# Patient Record
Sex: Female | Born: 2000 | Race: White | Hispanic: No | Marital: Single | Smoking: Never smoker
Health system: Southern US, Community
[De-identification: ages and names within clinical notes are randomized; demographics above are authoritative.]

## PROBLEM LIST (undated history)

## (undated) DIAGNOSIS — R569 Unspecified convulsions: Secondary | ICD-10-CM

## (undated) DIAGNOSIS — G43909 Migraine, unspecified, not intractable, without status migrainosus: Secondary | ICD-10-CM

## (undated) DIAGNOSIS — H539 Unspecified visual disturbance: Secondary | ICD-10-CM

## (undated) DIAGNOSIS — E669 Obesity, unspecified: Secondary | ICD-10-CM

## (undated) DIAGNOSIS — T7840XA Allergy, unspecified, initial encounter: Secondary | ICD-10-CM

## (undated) HISTORY — DX: Migraine, unspecified, not intractable, without status migrainosus: G43.909

## (undated) HISTORY — PX: NO PAST SURGERIES: SHX2092

---
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
## 2018-03-06 ENCOUNTER — Ambulatory Visit: Payer: Self-pay | Admitting: Physician Assistant

## 2018-03-06 NOTE — Progress Notes (Deleted)
       Patient: Robyn Key Female    DOB: 12/03/2000   17 y.o.   MRN: [id number] Visit Date: 03/06/2018  Today's Provider: Trey Sailors, PA-C   No chief complaint on file.  Subjective:    Neck Pain       Allergies  Allergen Reactions  . Mango Flavor Other (See Comments)    Tingling of lips, mom denies swelling of lips or tongue or difficulty breathing.  . Papaya Derivatives Other (See Comments)    Tingling of the lips, denies swelling of the lips or tongue or difficulty breathing.  Jacelyn Grip Other (See Comments)    Tingling of the lips, mom denies swelling of lips or tongue or difficulty breathing.     Current Outpatient Medications:  .  acetaminophen (TYLENOL) 500 MG tablet, Take 500 mg by mouth every 6 (six) hours as needed., Disp: , Rfl:  .  fluticasone (FLONASE) 50 MCG/ACT nasal spray, Place 2 sprays into both nostrils daily., Disp: 16 g, Rfl: 6 .  ibuprofen (ADVIL,MOTRIN) 200 MG tablet, Take 200 mg by mouth every 6 (six) hours as needed., Disp: , Rfl:   Review of Systems  HENT: Negative.   Respiratory: Negative.   Gastrointestinal: Negative.   Genitourinary: Negative.   Musculoskeletal: Positive for neck pain.  Neurological: Negative.     Social History   Tobacco Use  . Smoking status: Never Smoker  . Smokeless tobacco: Never Used  Substance Use Topics  . Alcohol use: No      Objective:   There were no vitals taken for this visit. There were no vitals filed for this visit.   Physical Exam      Assessment & Plan        Trey Sailors, PA-C  [facility]

## 2018-03-12 ENCOUNTER — Encounter: Payer: Self-pay | Admitting: Physician Assistant

## 2018-03-12 ENCOUNTER — Ambulatory Visit: Payer: Managed Care, Other (non HMO) | Admitting: Physician Assistant

## 2018-03-12 DIAGNOSIS — M542 Cervicalgia: Secondary | ICD-10-CM

## 2018-03-12 DIAGNOSIS — F0781 Postconcussional syndrome: Secondary | ICD-10-CM

## 2018-03-12 MED ORDER — CYCLOBENZAPRINE HCL 5 MG PO TABS: 5.0000 mg | ORAL_TABLET | Freq: Every day | ORAL | 0 refills | Status: AC

## 2018-03-12 NOTE — Patient Instructions (Signed)
Cervical Sprain    A cervical sprain is a stretch or tear in the tissues that connect bones (ligaments) in the neck. Most neck (cervical) sprains get better in 4-6 weeks.  Follow these instructions at home:  If you have a neck collar:   Wear it as told by your doctor. Do not take off (do not remove) the collar unless your doctor says that this is safe.   Ask your doctor before adjusting your collar.   If you have long hair, keep it outside of the collar.   Ask your doctor if you may take off the collar for cleaning and bathing. If you may take off the collar:  ? Follow instructions from your doctor about how to take off the collar safely.  ? Clean the collar by wiping it with mild soap and water. Let it air-dry all the way.  ? If your collar has removable pads:   Take the pads out every 1-2 days.   Hand wash the pads with soap and water.   Let the pads air-dry all the way before you put them back in the collar. Do not dry them in a clothes dryer. Do not dry them with a hair dryer.  ? Check your skin under the collar for irritation or sores. If you see any, tell your doctor.  Managing pain, stiffness, and swelling     Use a cervical traction device, if told by your doctor.   If told, put heat on the affected area. Do this before exercises (physical therapy) or as often as told by your doctor. Use the heat source that your doctor recommends, such as a moist heat pack or a heating pad.  ? Place a towel between your skin and the heat source.  ? Leave the heat on for 20-30 minutes.  ? Take the heat off (remove the heat) if your skin turns bright red. This is very important if you cannot feel pain, heat, or cold. You may have a greater risk of getting burned.   Put ice on the affected area.  ? Put ice in a plastic bag.  ? Place a towel between your skin and the bag.  ? Leave the ice on for 20 minutes, 2-3 times a day.  Activity   Do not drive while wearing a neck collar. If you do not have a neck collar, ask  your doctor if it is safe to drive.   Do not drive or use heavy machinery while taking prescription pain medicine or muscle relaxants, unless your doctor approves.   Do not lift anything that is heavier than 10 lb (4.5 kg) until your doctor tells you that it is safe.   Rest as told by your doctor.   Avoid activities that make you feel worse. Ask your doctor what activities are safe for you.   Do exercises as told by your doctor or physical therapist.  Preventing neck sprain   Practice good posture. Adjust your workstation to help with this, if needed.   Exercise regularly as told by your doctor or physical therapist.   Avoid activities that are risky or may cause a neck sprain (cervical sprain).  General instructions   Take over-the-counter and prescription medicines only as told by your doctor.   Do not use any products that contain nicotine or tobacco. This includes cigarettes and e-cigarettes. If you need help quitting, ask your doctor.   Keep all follow-up visits as told by your doctor. This is important.    Contact a doctor if:   You have pain or other symptoms that get worse.   You have symptoms that do not get better after 2 weeks.   You have pain that does not get better with medicine.   You start to have new, unexplained symptoms.   You have sores or irritated skin from wearing your neck collar.  Get help right away if:   You have very bad pain.   You have any of the following in any part of your body:  ? Loss of feeling (numbness).  ? Tingling.  ? Weakness.   You cannot move a part of your body (you have paralysis).   Your activity level does not improve.  Summary   A cervical sprain is a stretch or tear in the tissues that connect bones (ligaments) in the neck.   If you have a neck (cervical) collar, do not take off the collar unless your doctor says that this is safe.   Put ice on affected areas as told by your doctor.   Put heat on affected areas as told by your doctor.   Good  posture and regular exercise can help prevent a neck sprain from happening again.  This information is not intended to replace advice given to you by your health care provider. Make sure you discuss any questions you have with your health care provider.  Document Released: 07/31/2007 Document Revised: 10/24/2015 Document Reviewed: 10/24/2015  Elsevier Interactive Patient Education  2019 [employer]
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
## 2018-09-08 ENCOUNTER — Other Ambulatory Visit: Payer: Self-pay

## 2018-09-08 ENCOUNTER — Emergency Department: Payer: Managed Care, Other (non HMO)

## 2018-09-08 ENCOUNTER — Encounter: Payer: Self-pay | Admitting: *Deleted

## 2018-09-08 DIAGNOSIS — R102 Pelvic and perineal pain: Secondary | ICD-10-CM | POA: Insufficient documentation

## 2018-09-08 DIAGNOSIS — Z79899 Other long term (current) drug therapy: Secondary | ICD-10-CM | POA: Diagnosis not present

## 2018-09-08 DIAGNOSIS — N938 Other specified abnormal uterine and vaginal bleeding: Secondary | ICD-10-CM | POA: Insufficient documentation

## 2018-09-08 DIAGNOSIS — N939 Abnormal uterine and vaginal bleeding, unspecified: Secondary | ICD-10-CM | POA: Diagnosis present

## 2018-09-08 LAB — BASIC METABOLIC PANEL
Anion gap: 10 (ref 5–15)
BUN: 9 mg/dL (ref 6–20)
CO2: 26 mmol/L (ref 22–32)
Calcium: 9.4 mg/dL (ref 8.9–10.3)
Chloride: 104 mmol/L (ref 98–111)
Creatinine, Ser: 0.63 mg/dL (ref 0.44–1.00)
GFR calc Af Amer: >60 mL/min (ref >60)
GFR calc non Af Amer: >60 mL/min (ref >60)
Glucose, Bld: 94 mg/dL (ref 70–99)
Potassium: 3.8 mmol/L (ref 3.5–5.1)
Sodium: 140 mmol/L (ref 135–145)

## 2018-09-08 LAB — CBC
HCT: 43.7 % (ref 36.0–46.0)
Hemoglobin: 14 g/dL (ref 12.0–15.0)
MCH: 26.3 pg (ref 26.0–34.0)
MCHC: 32 g/dL (ref 30.0–36.0)
MCV: 82.1 fL (ref 80.0–100.0)
Platelets: 280 10*3/uL (ref 150–400)
RBC: 5.32 MIL/uL — ABNORMAL HIGH (ref 3.87–5.11)
RDW: 14.4 % (ref 11.5–15.5)
WBC: 12 10*3/uL — ABNORMAL HIGH (ref 4.0–10.5)
nRBC: 0 % (ref 0.0–0.2)

## 2018-09-08 LAB — POC URINE PREG, ED: Preg Test, Ur: NEGATIVE

## 2018-09-08 NOTE — ED Triage Notes (Signed)
Pt reporting vaginal bleeding and worsening cramps for over a week. Pt reports the bleeding stopped yesterday and then started again today heavier and the cramping has worsened as well. Pt denies any chance she could be pregnant.

## 2018-09-09 ENCOUNTER — Emergency Department
Admission: EM | Admit: 2018-09-09 | Discharge: 2018-09-09 | Disposition: A | Discharge status: Home / Self Care | Payer: Managed Care, Other (non HMO) | Attending: Emergency Medicine | Admitting: Emergency Medicine

## 2018-09-09 DIAGNOSIS — N938 Other specified abnormal uterine and vaginal bleeding: Secondary | ICD-10-CM

## 2018-09-09 DIAGNOSIS — N939 Abnormal uterine and vaginal bleeding, unspecified: Secondary | ICD-10-CM

## 2018-09-09 MED ORDER — TRAMADOL HCL 50 MG PO TABS: 50.0000 mg | ORAL_TABLET | Freq: Four times a day (QID) | ORAL | 0 refills | Status: AC | PRN

## 2018-09-09 MED ORDER — TRAMADOL HCL 50 MG PO TABS
50.0000 mg | ORAL_TABLET | Freq: Once | ORAL | Status: AC
  Administered 2018-09-09: 02:00:00 50 mg via ORAL
  Filled 2018-09-09: qty 1

## 2018-09-09 NOTE — ED Notes (Signed)
Dr Brown and Butch, RN at bedside at this time. 

## 2018-09-09 NOTE — ED Provider Notes (Signed)
[facility] Emergency Department Provider Note    First MD Initiated Contact with Patient 09/09/18 0215     (approximate)  I have reviewed the triage vital signs and the nursing notes.   HISTORY  Chief Complaint Vaginal Bleeding    HPI Robyn Key is a 18 y.o. female with below list of previous medical conditions including irregular menses in the past presents emergency department with history of vaginal bleeding x7 days with associated pelvic cramping.  Patient states that bleeding stopped yesterday and then subsequently came back today.  Patient describes current bleeding as spotting".  Patient states current pain score 6 out of 10.  Patient denies any vomiting no diarrhea constipation.  Patient denies any urinary symptoms.          Past Medical History:  Diagnosis Date  . Allergy    seasonal  . Migraine headache   . Obesity   . Seizures (HCC)    when she was younger, 2 seizures, last one 6-7 years ago.  . Vision abnormalities    wears glasses    Patient Active Problem List   Diagnosis Date Noted  . Allergic rhinitis 12/08/2014  . Dietary counseling and surveillance 12/08/2014  . Episode of syncope 12/08/2014  . Low iron 12/08/2014  . Excess, menstruation 12/08/2014  . Excess weight 12/08/2014    Past Surgical History:  Procedure Laterality Date  . INTRAUTERINE DEVICE INSERTION  01/09/2017   Mirena  . NO PAST SURGERIES    . TONSILLECTOMY AND ADENOIDECTOMY Bilateral 05/23/2016   Procedure: TONSILLECTOMY AND ADENOIDECTOMY;  Surgeon: Bud Facereighton Vaught, MD;  Location: [facility] ORS;  Service: ENT;  Laterality: Bilateral;    Prior to Admission medications   Medication Sig Start Date End Date Taking? Authorizing Provider  acetaminophen (TYLENOL) 500 MG tablet Take 500 mg by mouth every 6 (six) hours as needed.    [provider]  cyclobenzaprine (FLEXERIL) 5 MG tablet Take 1 tablet (5 mg total) by mouth at bedtime. 03/12/18   Trey SailorsPollak,  Adriana M, PA-C  ibuprofen (ADVIL,MOTRIN) 200 MG tablet Take 200 mg by mouth every 6 (six) hours as needed.    [provider]  traMADol (ULTRAM) 50 MG tablet Take 1 tablet (50 mg total) by mouth every 6 (six) hours as needed. 09/09/18 09/09/19  Darci CurrentBrown, Blakely N, MD    Allergies Mango flavor, Papaya derivatives, and Pineapple  Family History  Problem Relation Age of Onset  . Hypertension Mother   . Cancer Father        prostate  . Hypertension Maternal Grandfather     Social History Social History   Tobacco Use  . Smoking status: Never Smoker  . Smokeless tobacco: Never Used  Substance Use Topics  . Alcohol use: No  . Drug use: Not Currently    Review of Systems Constitutional: No fever/chills Eyes: No visual changes. ENT: No sore throat. Cardiovascular: Denies chest pain. Respiratory: Denies shortness of breath. Gastrointestinal: No abdominal pain.  No nausea, no vomiting.  No diarrhea.  No constipation. Genitourinary: Negative for dysuria.  Positive for vaginal bleeding.  Positive for pelvic pain  Musculoskeletal: Negative for neck pain.  Negative for back pain. Integumentary: Negative for rash. Neurological: Negative for headaches, focal weakness or numbness.   ____________________________________________   PHYSICAL EXAM:  VITAL SIGNS: ED Triage Vitals  Enc Vitals Group     BP 09/08/18 1849 134/70     Pulse Rate 09/08/18 1849 (!) 103     Resp 09/08/18  1849 16     Temp 09/08/18 1849 98.7 F (37.1 C)     Temp Source 09/08/18 1849 Oral     SpO2 09/08/18 1849 99 %     Weight 09/08/18 1850 122.5 kg (270 lb)     Height 09/08/18 1850 1.702 m (5\' 7" )     Head Circumference --      Peak Flow --      Pain Score 09/08/18 1849 7     Pain Loc --      Pain Edu? --      Excl. in [place]? --     Constitutional: Alert and oriented. Well appearing and in no acute distress. Eyes: Conjunctivae are normal.  Mouth/Throat: Mucous membranes are moist. Oropharynx  non-erythematous. Neck: No stridor.   Cardiovascular: Normal rate, regular rhythm. Good peripheral circulation. Grossly normal heart sounds. Respiratory: Normal respiratory effort.  No retractions. No audible wheezing. Gastrointestinal: Soft and nontender. No distention.  Genitourinary: No gross abnormality noted external genitalia.  Scant vaginal bleeding noted Musculoskeletal: No lower extremity tenderness nor edema. No gross deformities of extremities. Neurologic:  Normal speech and language. No gross focal neurologic deficits are appreciated.  Skin:  Skin is warm, dry and intact. No rash noted. Psychiatric: Mood and affect are normal. Speech and behavior are normal.  ____________________________________________   LABS (all labs ordered are listed, but only abnormal results are displayed)  Labs Reviewed  CBC - Abnormal; Notable for the following components:      Result Value   WBC 12.0 (*)    RBC 5.32 (*)    All other components within normal limits  BASIC METABOLIC PANEL  POC URINE PREG, ED   _________________________ RADIOLOGY I, Wykoff Ernst Bowler, personally viewed and evaluated these images (plain radiographs) as part of my medical decision making, as well as reviewing the written report by the radiologist.  ED MD interpretation: IUD within the uterus appropriately positioned numerous ovarian follicles right greater than left suggest possible cystic ovaries  Official radiology report(s): US Pelvic Complete With Transvaginal  Result Date: 09/09/2018 CLINICAL DATA:  Abnormal uterine bleeding with cramping EXAM: TRANSABDOMINAL AND TRANSVAGINAL ULTRASOUND OF PELVIS TECHNIQUE: Both transabdominal and transvaginal ultrasound examinations of the pelvis were performed. Transabdominal technique was performed for global imaging of the pelvis including uterus, ovaries, adnexal regions, and pelvic cul-de-sac. It was necessary to proceed with endovaginal exam following the transabdominal  exam to visualize the uterus endometrium ovaries. COMPARISON:  CT 04/19/2009 FINDINGS: Uterus Measurements: 7.2 x 3.7 x 3.1 cm = volume: 42 mL. No fibroids or other mass visualized. Endometrium Thickness: 10.3 mm.  Intrauterine device within the uterus. Right ovary Measurements: 4.3 x 2.8 x 2.2 cm = volume: 13 mL. Paraovarian cyst measuring 2.3 x 2.3 x 1.8 cm. Numerous ovarian follicles. Left ovary Measurements: 3.3 x 3 x 2.4 cm = volume: 13 mL. Normal appearance/no adnexal mass. Numerous ovarian follicles. Other findings Small free fluid in the right adnexa. IMPRESSION: 1. IUD within the uterus appears appropriately position. Endometrial thickness of 10.3 mm. If bleeding remains unresponsive to hormonal or medical therapy, sonohysterogram should be considered for focal lesion work-up. (Ref: Radiological Reasoning: Algorithmic Workup of Abnormal Vaginal Bleeding with Endovaginal Sonography and Sonohysterography. AJR 2008; 841:Y60-63) 2. 2.3 cm right paraovarian cyst with free fluid in the right adnexa. 3. Numerous ovarian follicles right greater than left, although ovarian size is normal, appearance suggests possible polycystic ovaries. Electronically Signed   By: Donavan Foil M.D.   On: 09/09/2018 00:43  ____________________________________________ Procedures   ____________________________________________   INITIAL IMPRESSION / MDM / ASSESSMENT AND PLAN / ED COURSE  As part of my medical decision making, I reviewed the following data within the electronic MEDICAL RECORD NUMBER   18 year old female presented with above-stated history and physical exam secondary to vaginal bleeding and pelvic cramping.  Ultrasound revealed thickened endometrium with numerous ovarian follicles concerning for polycystic ovaries.  Patient given tramadol in the emergency department will be prescribed same at home.  Patient referred to Dr. Dal[facility] for abnormal vaginal bleeding.   ____________________________________________  FINAL CLINICAL IMPRESSION(S) / ED DIAGNOSES  Final diagnoses:  Abnormal vaginal bleeding  Dysfunctional uterine bleeding     MEDICATIONS GIVEN DURING THIS VISIT:  Medications  traMADol (ULTRAM) tablet 50 mg (50 mg Oral Given 09/09/18 0219)     ED Discharge Orders         Ordered    traMADol (ULTRAM) 50 MG tablet  Every 6 hours PRN     09/09/18 0239          *Please note:  Robyn Key was evaluated in Emergency Department on 09/09/2018 for the symptoms described in the history of present illness. She was evaluated in the context of the global COVID-19 pandemic, which necessitated consideration that the patient might be at risk for infection with the SARS-CoV-2 virus that causes COVID-19. Institutional protocols and algorithms that pertain to the evaluation of patients at risk for COVID-19 are in a state of rapid change based on information released by regulatory bodies including the CDC and federal and state organizations. These policies and algorithms were followed during the patient's care in the ED.  Some ED evaluations and interventions may be delayed as a result of limited staffing during the pandemic.*  Note:  This document was prepared using Dragon voice recognition software and may include unintentional dictation errors.   Darci CurrentBrown, Gambell N, MD 09/09/18 [phone]

## 2018-09-15 ENCOUNTER — Other Ambulatory Visit: Payer: Self-pay | Admitting: Family Medicine

## 2018-09-15 DIAGNOSIS — Z20828 Contact with and (suspected) exposure to other viral communicable diseases: Secondary | ICD-10-CM

## 2018-09-15 DIAGNOSIS — Z20822 Contact with and (suspected) exposure to covid-19: Secondary | ICD-10-CM

## 2018-09-16 ENCOUNTER — Other Ambulatory Visit: Payer: Self-pay

## 2018-09-16 DIAGNOSIS — Z20822 Contact with and (suspected) exposure to covid-19: Secondary | ICD-10-CM

## 2018-09-20 LAB — NOVEL CORONAVIRUS, NAA: SARS-CoV-2, NAA: NOT DETECTED

## 2018-09-21 ENCOUNTER — Telehealth: Payer: Self-pay | Admitting: Physician Assistant

## 2018-09-21 NOTE — Telephone Encounter (Signed)
Pt called asking if we can print off her report of the negative Covid test that she had done.  She would like to come by and pick it up  Thanks teri

## 2018-09-21 NOTE — Telephone Encounter (Signed)
Patient was advised that her test results was printed off and she could come pick the results up.

## 2018-11-16 ENCOUNTER — Encounter: Payer: Self-pay | Admitting: Gynecology

## 2021-01-11 IMAGING — US US PELVIS COMPLETE WITH TRANSVAGINAL
1 series · 13 of 25 positions shown · non-contrast
Comparison: CT 04/19/2009

CLINICAL DATA: Abnormal uterine bleeding with cramping

EXAM:
TRANSABDOMINAL AND TRANSVAGINAL ULTRASOUND OF PELVIS
TECHNIQUE: Both transabdominal and transvaginal ultrasound examinations of the
pelvis were performed. Transabdominal technique was performed for
global imaging of the pelvis including uterus, ovaries, adnexal
regions, and pelvic cul-de-sac. It was necessary to proceed with
endovaginal exam following the transabdominal exam to visualize the
uterus endometrium ovaries.

[Series 1: us pelvis complete with transvaginal · 13 of 64 slices shown]
[im 1/64]
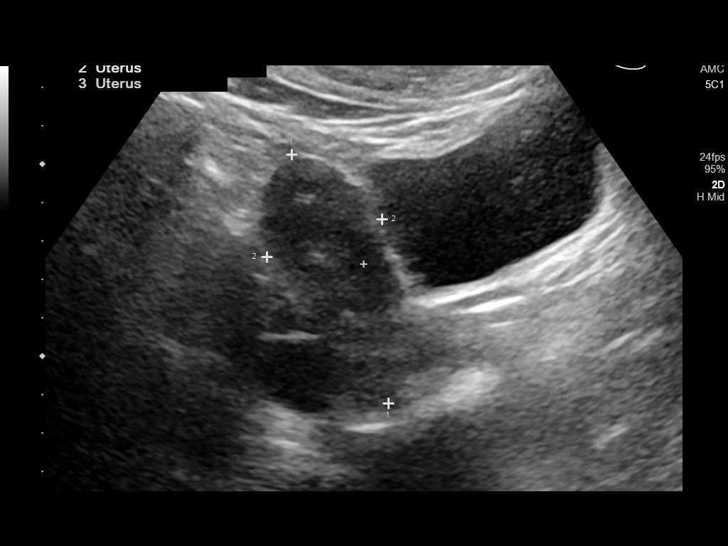
[im 6/64]
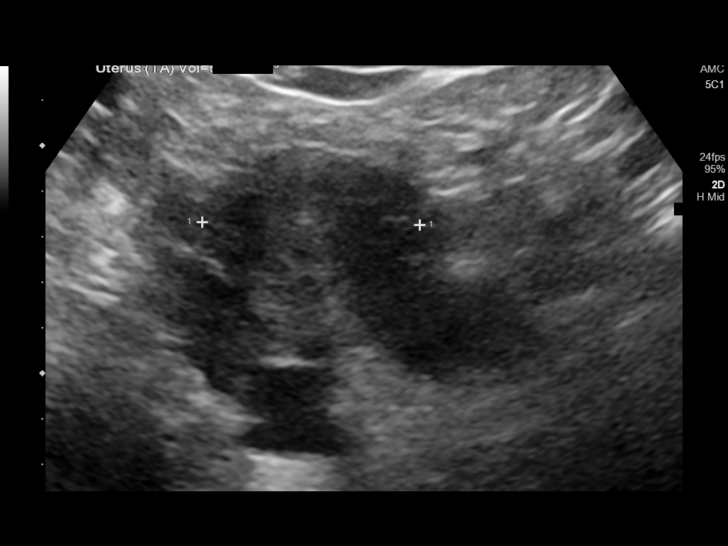
[im 11/64]
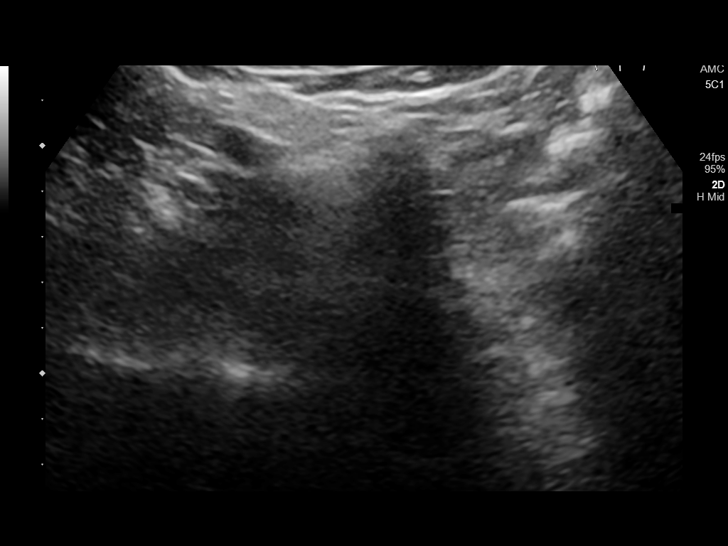
[im 16/64]
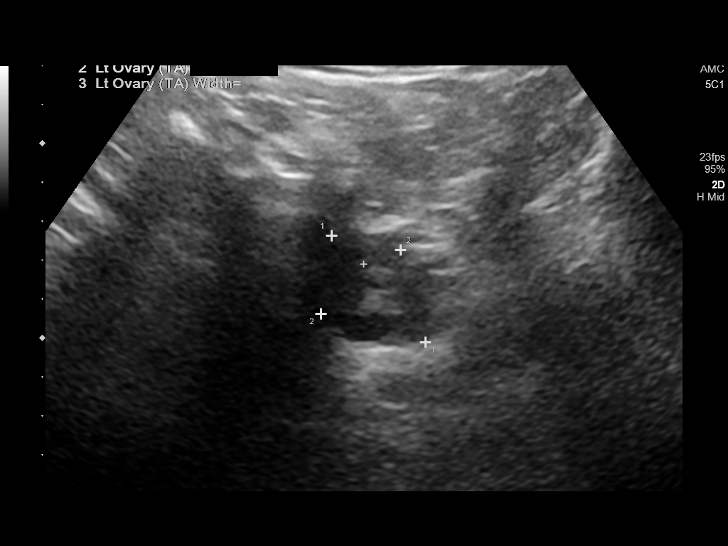
[im 22/64]
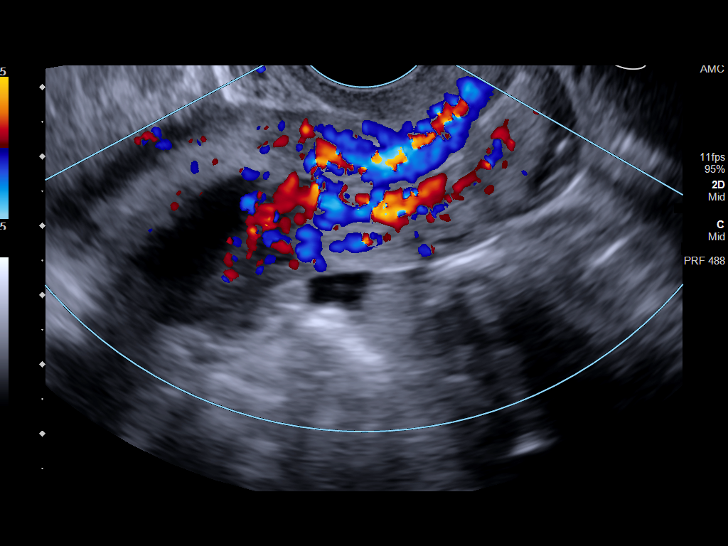
[im 27/64]
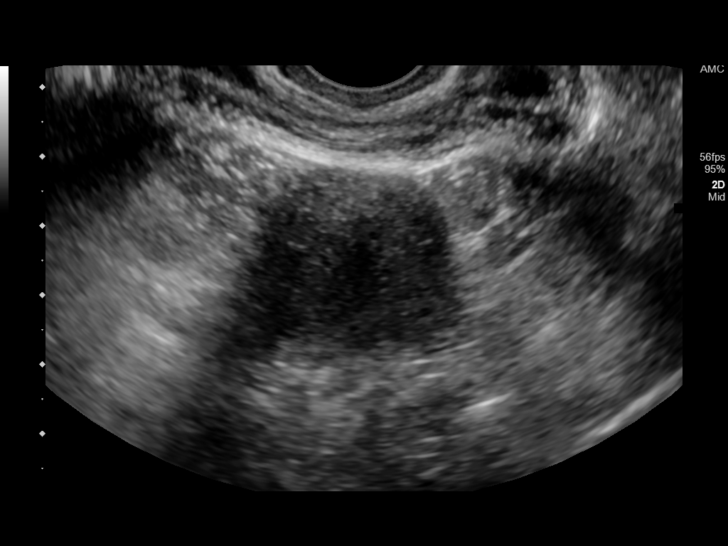
[im 32/64]
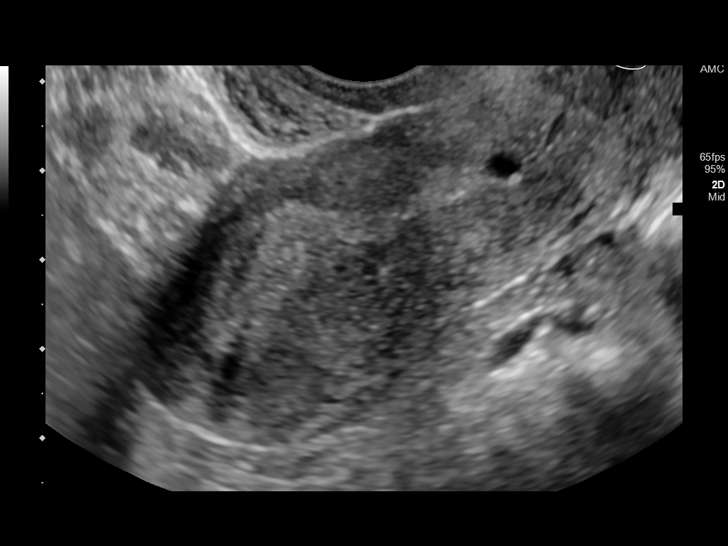
[im 37/64]
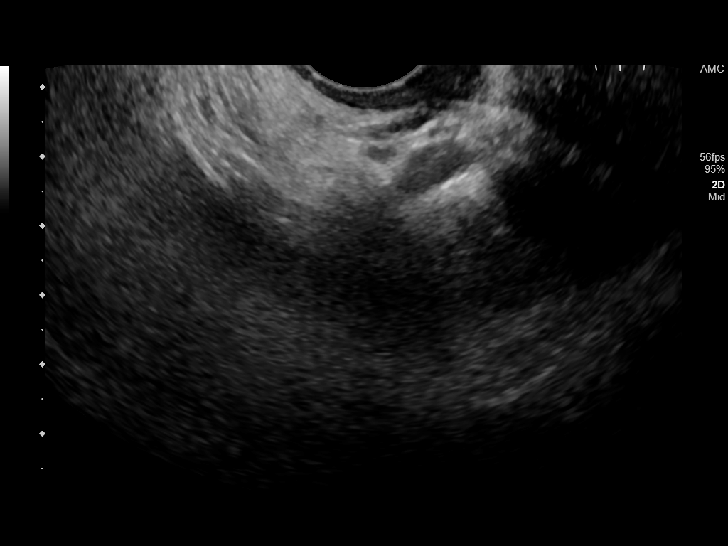
[im 43/64]
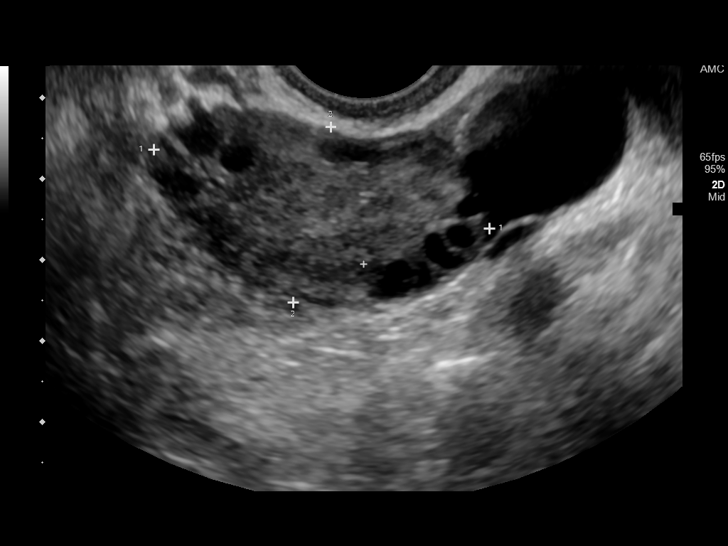
[im 48/64]
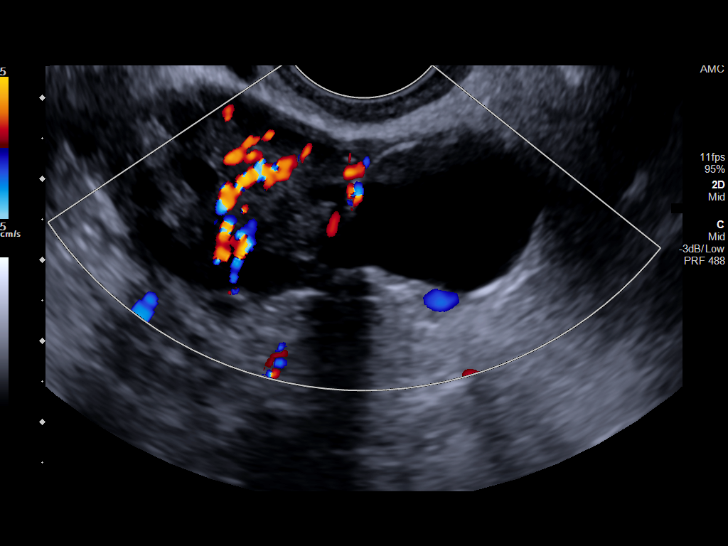
[im 53/64]
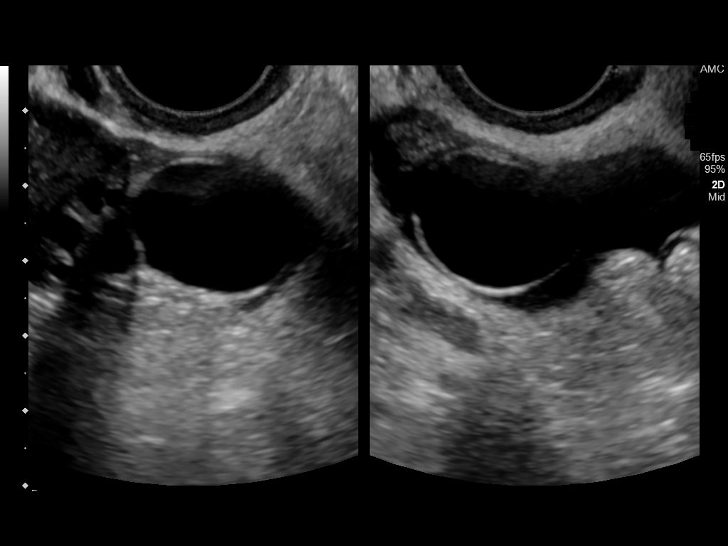
[im 58/64]
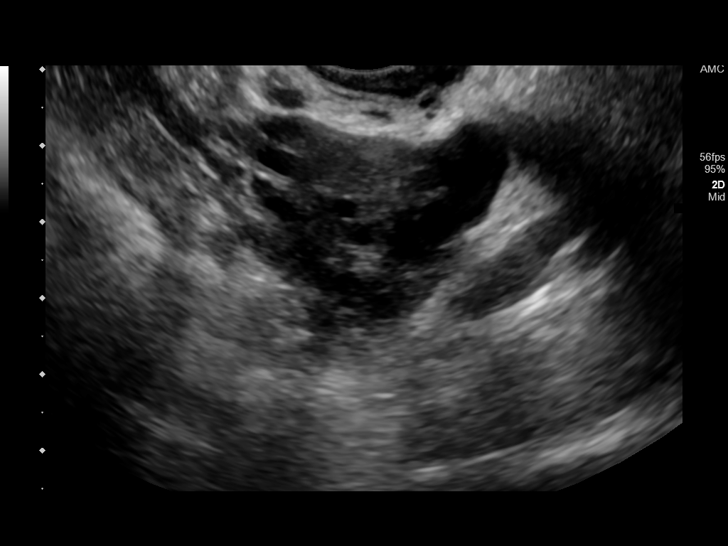
[im 64/64]
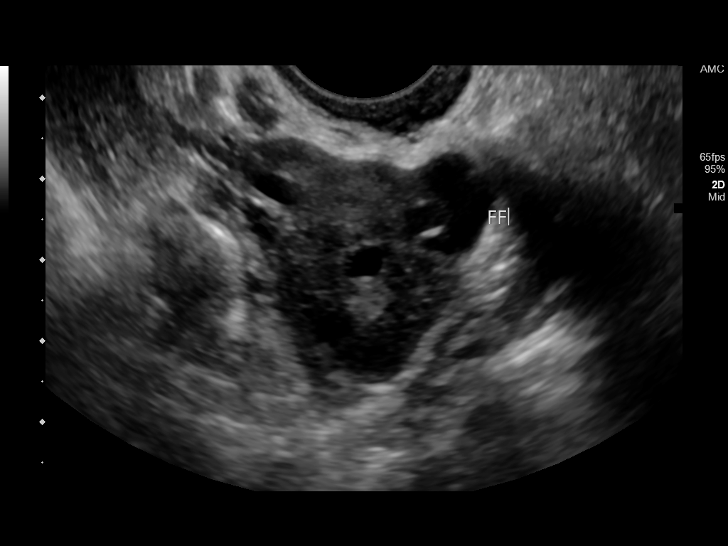

[13 of 25 positions shown; findings below may reference images not displayed]

FINDINGS: Uterus

Measurements: 7.2 x 3.7 x 3.1 cm = volume: 42 mL. No fibroids or
other mass visualized.

Endometrium

Thickness: 10.3 mm.  Intrauterine device within the uterus.

Right ovary

Measurements: 4.3 x 2.8 x 2.2 cm = volume: 13 mL. Paraovarian cyst
measuring 2.3 x 2.3 x 1.8 cm. Numerous ovarian follicles.

Left ovary

Measurements: 3.3 x 3 x 2.4 cm = volume: 13 mL. Normal appearance/no
adnexal mass. Numerous ovarian follicles.

Other findings

Small free fluid in the right adnexa.
IMPRESSION: 1. IUD within the uterus appears appropriately position. Endometrial
thickness of 10.3 mm. If bleeding remains unresponsive to hormonal
or medical therapy, sonohysterogram should be considered for focal
lesion work-up. (Ref: Radiological Reasoning: Algorithmic Workup of
Abnormal Vaginal Bleeding with Endovaginal Sonography and
Sonohysterography. [id number]; 191:S68-73)
2. 2.3 cm right paraovarian cyst with free fluid in the right
adnexa.
3. Numerous ovarian follicles right greater than left, although
ovarian size is normal, appearance suggests possible polycystic
ovaries.

## 2022-06-11 ENCOUNTER — Encounter: Payer: Self-pay | Admitting: Emergency Medicine

## 2022-06-11 ENCOUNTER — Other Ambulatory Visit: Payer: Self-pay

## 2022-06-11 ENCOUNTER — Emergency Department
Admission: EM | Admit: 2022-06-11 | Discharge: 2022-06-11 | Disposition: A | Discharge status: Home / Self Care | Payer: Managed Care, Other (non HMO) | Attending: Emergency Medicine | Admitting: Emergency Medicine

## 2022-06-11 DIAGNOSIS — K029 Dental caries, unspecified: Secondary | ICD-10-CM | POA: Diagnosis not present

## 2022-06-11 DIAGNOSIS — K047 Periapical abscess without sinus: Secondary | ICD-10-CM

## 2022-06-11 DIAGNOSIS — K0889 Other specified disorders of teeth and supporting structures: Secondary | ICD-10-CM

## 2022-06-11 MED ORDER — LIDOCAINE VISCOUS HCL 2 % MT SOLN
15.0000 mL | Freq: Once | OROMUCOSAL | Status: AC
  Administered 2022-06-11: 15 mL via OROMUCOSAL
  Filled 2022-06-11: qty 15

## 2022-06-11 MED ORDER — HYDROCODONE-ACETAMINOPHEN 5-325 MG PO TABS
1.0000 | ORAL_TABLET | Freq: Once | ORAL | Status: AC
  Administered 2022-06-11: 1 via ORAL
  Filled 2022-06-11: qty 1

## 2022-06-11 MED ORDER — HYDROCODONE-ACETAMINOPHEN 5-325 MG PO TABS: 1.0000 | ORAL_TABLET | Freq: Four times a day (QID) | ORAL | 0 refills | Status: AC | PRN

## 2022-06-11 MED ORDER — AMOXICILLIN 500 MG PO CAPS
500.0000 mg | ORAL_CAPSULE | Freq: Once | ORAL | Status: AC
  Administered 2022-06-11: 500 mg via ORAL
  Filled 2022-06-11: qty 1

## 2022-06-11 MED ORDER — AMOXICILLIN 500 MG PO CAPS: 500.0000 mg | ORAL_CAPSULE | Freq: Three times a day (TID) | ORAL | 0 refills | Status: AC

## 2022-06-11 MED ORDER — IBUPROFEN 600 MG PO TABS
600.0000 mg | ORAL_TABLET | Freq: Once | ORAL | Status: AC
Start: 2022-06-11 — End: 2022-06-11
  Administered 2022-06-11: 600 mg via ORAL
  Filled 2022-06-11: qty 1

## 2022-06-11 NOTE — ED Triage Notes (Signed)
Patient ambulatory to triage with steady gait, without difficulty or distress noted; pt reports left sided upper/lower dental pain x wk

## 2022-06-11 NOTE — Discharge Instructions (Signed)
1. Take antibiotic as prescribed (amoxicillin  three times daily x 7 days). 2. Take ibuprofen as needed for pain; Norco as needed for more severe pain.   3. Return to the ER for worsening symptoms, persistent vomiting, fever, difficulty breathing or other concerns.  OPTIONS FOR DENTAL FOLLOW UP CARE  [place] [facility] of Health and Human Services - Local Safety Net Dental Clinics [email or web address]   [facility] [phone])  [facility] [phone])  [place] [phone] ext [phone])  [facility] [phone])  [facility] [phone]) This clinic caters to the indigent population and is on a lottery system. Location: [employer] [facility], [employer], [address] [place] Clinic Hours: Wednesdays from 6pm - 9pm, patients seen by a lottery system. For dates, call or go to [email or web address] Services: Cleanings, fillings and simple extractions. Payment Options: DENTAL WORK IS FREE OF CHARGE. Bring proof of income or support. Best way to get seen: Arrive at 5:15 pm - this is a lottery, NOT first come/first serve, so arriving earlier will not increase your chances of being seen.     [facility] [phone] Select option 1 for emergencies   Location: [facility], [place], [address] [place] Clinic Hours: No walk-ins accepted - call the day before to schedule an appointment. Check in times are 9:30 am and 1:30 pm. Services: Simple extractions, temporary fillings, pulpectomy/pulp debridement, uncomplicated abscess drainage. Payment Options: PAYMENT IS DUE AT THE TIME OF SERVICE.  Fee is usually $100-200, additional surgical procedures (e.g. abscess drainage) may be extra. Cash, checks, Visa/MasterCard accepted.  Can file Medicaid if patient is covered for dental - patient  should call case worker to check. No discount for [facility] patients. Best way to get seen: MUST call the day before and get onto the schedule. Can usually be seen the next 1-2 days. No walk-ins accepted.     [facility] [phone]   Location: [facility], [address] [place] Clinic Hours: M, W, Th, F 8am or 1:30pm, Tues 9a or 1:30 - first come/first served. Services: Simple extractions, temporary fillings, uncomplicated abscess drainage.  You do not need to be an [facility] resident. Payment Options: PAYMENT IS DUE AT THE TIME OF SERVICE. Dental insurance, otherwise sliding scale - bring proof of income or support. Depending on income and treatment needed, cost is usually $50-200. Best way to get seen: Arrive early as it is first come/first served.     [facility] [phone]   Location: [address] Clinic Hours: Mon-Thu 8a-5p Services: Most basic dental services including extractions and fillings. Payment Options: PAYMENT IS DUE AT THE TIME OF SERVICE. Sliding scale, up to 50% off - bring proof if income or support. Medicaid with dental option accepted. Best way to get seen: Call to schedule an appointment, can usually be seen within 2 weeks OR they will try to see walk-ins - show up at 8a or 2p (you may have to wait).     [facility] [phone] [place] RESIDENTS ONLY   Location: [facility], [address], [place], [place] [address] Clinic Hours: By appointment only. Monday - Thursday 8am-5pm, Friday 8am-12pm Services: Cleanings, fillings, extractions. Payment Options: PAYMENT IS DUE AT THE TIME OF SERVICE. Cash, Visa or MasterCard. Sliding scale - $30 minimum per service. Best way to get seen: Come in to office, complete packet  and make an appointment - need proof of income or support monies for each household member  and proof of [facility] residence. Usually takes about a month to get in.     [facility] [phone]   Location: [address], [facility] Clinic Hours: Walk-in Urgent Care Dental Services are offered Monday-Friday mornings only. The numbers of emergencies accepted daily is limited to the number of providers available. Maximum 15 - Mondays, Wednesdays & Thursdays Maximum 10 - Tuesdays & Fridays Services: You do not need to be a [facility] resident to be seen for a dental emergency. Emergencies are defined as pain, swelling, abnormal bleeding, or dental trauma. Walkins will receive x-rays if needed. NOTE: Dental cleaning is not an emergency. Payment Options: PAYMENT IS DUE AT THE TIME OF SERVICE. Minimum co-pay is $40.00 for uninsured patients. Minimum co-pay is $3.00 for Medicaid with dental coverage. Dental Insurance is accepted and must be presented at time of visit. Medicare does not cover dental. Forms of payment: Cash, credit card, checks. Best way to get seen: If not previously registered with the clinic, walk-in dental registration begins at 7:15 am and is on a first come/first serve basis. If previously registered with the clinic, call to make an appointment.     [facility] [phone] [place] RESIDENTS ONLY   Location: [address], [place], [place] Clinic Hours: Mon-Thu 10a-2p Services: Extractions only! Payment Options: FREE (donations accepted) - bring proof of income or support Best way to get seen: Call and schedule an appointment OR come at 8am on the 1st Monday of every month (except for holidays) when it is first come/first served.     [facility] [phone]   Location: [address] [place], [place] Clinic Hours: Friday mornings Services, Payment Options, Best way to get seen: Call for info

## 2022-06-11 NOTE — ED Provider Notes (Signed)
[facility] Provider Note    Event Date/Time   First MD Initiated Contact with Patient 06/11/22 [phone]     (approximate)   History   Dental Pain   HPI  Robyn Key is a 22 y.o. female who presents to the ED from home with a chief complaint of dentalgia.  Patient reports left-sided upper greater than lower molar pain x 1 week.  No acute injury or fracture.  Denies fever/chills, facial swelling, nausea/vomiting or dizziness.     Past Medical History   Past Medical History:  Diagnosis Date   Allergy    seasonal   Migraine headache    Obesity    Seizures    when she was younger, 2 seizures, last one 6-7 years ago.   Vision abnormalities    wears glasses     Active Problem List   Patient Active Problem List   Diagnosis Date Noted   Allergic rhinitis 12/08/2014   Dietary counseling and surveillance 12/08/2014   Episode of syncope 12/08/2014   Low iron 12/08/2014   Excess, menstruation 12/08/2014   Excess weight 12/08/2014     Past Surgical History   Past Surgical History:  Procedure Laterality Date   INTRAUTERINE DEVICE INSERTION  01/09/2017   Mirena   NO PAST SURGERIES     TONSILLECTOMY AND ADENOIDECTOMY Bilateral 05/23/2016   Procedure: TONSILLECTOMY AND ADENOIDECTOMY;  Surgeon: Bud Face, MD;  Location: [facility] ORS;  Service: ENT;  Laterality: Bilateral;     Home Medications   Prior to Admission medications   Medication Sig Start Date End Date Taking? Authorizing Provider  amoxicillin (AMOXIL) 500 MG capsule Take 1 capsule (500 mg total) by mouth 3 (three) times daily. 06/11/22  Yes Irean Hong, MD  HYDROcodone-acetaminophen (NORCO) 5-325 MG tablet Take 1 tablet by mouth every 6 (six) hours as needed for moderate pain. 06/11/22  Yes Irean Hong, MD  acetaminophen (TYLENOL) 500 MG tablet Take 500 mg by mouth every 6 (six) hours as needed.    [provider]  cyclobenzaprine (FLEXERIL) 5 MG tablet Take 1 tablet (5 mg  total) by mouth at bedtime. 03/12/18   Trey Sailors, PA-C  ibuprofen (ADVIL,MOTRIN) 200 MG tablet Take 200 mg by mouth every 6 (six) hours as needed.    [provider]     Allergies  Mango flavor, Papaya derivatives, and Pineapple   Family History   Family History  Problem Relation Age of Onset   Hypertension Mother    Cancer Father        prostate   Hypertension Maternal Grandfather      Physical Exam  Triage Vital Signs: ED Triage Vitals  Enc Vitals Group     BP 06/11/22 0155 117/75     Pulse Rate 06/11/22 0155 (!) 56     Resp 06/11/22 0155 14     Temp 06/11/22 0155 98.2 F (36.8 C)     Temp Source 06/11/22 0155 Oral     SpO2 06/11/22 0155 97 %     Weight 06/11/22 0131 220 lb (99.8 kg)     Height 06/11/22 0131  (1.727 m)     Head Circumference --      Peak Flow --      Pain Score 06/11/22 0131 8     Pain Loc --      Pain Edu? --      Excl. in GC? --     Updated Vital Signs: BP  117/75 (BP Location: Left Arm)   Pulse (!) 56   Temp 98.2 F (36.8 C) (Oral)   Resp 14   Ht  (1.727 m)   Wt 99.8 kg   SpO2 97%   BMI 33.45 kg/m    General: Awake, no distress.  CV:  Good peripheral perfusion.  Resp:  Normal effort.  Abd:  No distention.  Other:  Scattered dental caries.  Possible developing abscess left upper posterior molar.  No facial swelling.   ED Results / Procedures / Treatments  Labs (all labs ordered are listed, but only abnormal results are displayed) Labs Reviewed - No data to display   EKG  None   RADIOLOGY None   Official radiology report(s): No results found.   PROCEDURES:  Critical Care performed: No  Procedures   MEDICATIONS ORDERED IN ED: Medications  ibuprofen (ADVIL) tablet 600 mg (600 mg Oral Given 06/11/22 0201)     IMPRESSION / MDM / ASSESSMENT AND PLAN / ED COURSE  I reviewed the triage vital signs and the nursing notes.                             22 year old female presenting with  dentalgia and possible dental abscess.  Will start on amoxicillin, pain medicine and refer to dental clinic for follow-up.  Strict return precautions given.  Patient verbalizes understanding and agrees with plan of care.  Patient's presentation is most consistent with acute, uncomplicated illness.  FINAL CLINICAL IMPRESSION(S) / ED DIAGNOSES   Final diagnoses:  Dental infection  Pain, dental     Rx / DC Orders   ED Discharge Orders          Ordered    amoxicillin (AMOXIL) 500 MG capsule  3 times daily        06/11/22 0435    HYDROcodone-acetaminophen (NORCO) 5-325 MG tablet  Every 6 hours PRN        06/11/22 0435             Note:  This document was prepared using Dragon voice recognition software and may include unintentional dictation errors.   Irean Hong, MD 06/11/22 [phone]

## 2022-08-02 ENCOUNTER — Other Ambulatory Visit: Payer: Self-pay

## 2022-08-02 ENCOUNTER — Emergency Department: Payer: Managed Care, Other (non HMO)

## 2022-08-02 DIAGNOSIS — Y92009 Unspecified place in unspecified non-institutional (private) residence as the place of occurrence of the external cause: Secondary | ICD-10-CM | POA: Insufficient documentation

## 2022-08-02 DIAGNOSIS — S6991XA Unspecified injury of right wrist, hand and finger(s), initial encounter: Secondary | ICD-10-CM | POA: Diagnosis present

## 2022-08-02 DIAGNOSIS — X58XXXA Exposure to other specified factors, initial encounter: Secondary | ICD-10-CM | POA: Insufficient documentation

## 2022-08-02 DIAGNOSIS — S60221A Contusion of right hand, initial encounter: Secondary | ICD-10-CM | POA: Insufficient documentation

## 2022-08-02 NOTE — ED Triage Notes (Signed)
Pt to ed from home for possible hand injury. Pt doesn't know what she might have done to it but she has bruising, swelling and pain in right index finger, into hand. Pt is caox4, in no acute distress and ambulatory in triage.

## 2022-08-03 ENCOUNTER — Emergency Department
Admission: EM | Admit: 2022-08-03 | Discharge: 2022-08-03 | Disposition: A | Discharge status: Home / Self Care | Payer: Managed Care, Other (non HMO) | Attending: Emergency Medicine | Admitting: Emergency Medicine

## 2022-08-03 ENCOUNTER — Emergency Department: Payer: Managed Care, Other (non HMO)

## 2022-08-03 DIAGNOSIS — S60221A Contusion of right hand, initial encounter: Secondary | ICD-10-CM

## 2022-08-03 DIAGNOSIS — M79641 Pain in right hand: Secondary | ICD-10-CM

## 2022-08-03 NOTE — ED Provider Notes (Signed)
[facility] Provider Note    Event Date/Time   First MD Initiated Contact with Patient 08/03/22 [phone]     (approximate)   History   Hand Injury (Right Hand)   HPI  DUAA Robyn Key is a 22 y.o. female who presents to the ED for evaluation of Hand Injury (Right Hand)   Patient presents to the ED for evaluation of bruising and pain to the base of the second finger of her right hand.  She reports that she think she jammed her finger and had an accidental direct trauma to this area while she was working, [employer].  This happened about 24 hours ago.  She reports bruising developing since then, minor swelling and tingling to the finger distal to this area.   Physical Exam   Triage Vital Signs: ED Triage Vitals  Enc Vitals Group     BP 08/02/22 2341 (!) 108/98     Pulse Rate 08/02/22 2341 78     Resp 08/02/22 2339 16     Temp 08/02/22 2339 98 F (36.7 C)     Temp Source 08/02/22 2339 Oral     SpO2 08/02/22 2339 98 %     Weight 08/02/22 2339 235 lb (106.6 kg)     Height 08/02/22 2339 5\' 8"  (1.727 m)     Head Circumference --      Peak Flow --      Pain Score 08/02/22 2339 5     Pain Loc --      Pain Edu? --      Excl. in GC? --     Most recent vital signs: Vitals:   08/02/22 2339 08/02/22 2341  BP:  (!) 108/98  Pulse:  78  Resp: 16   Temp: 98 F (36.7 C)   SpO2: 98%     General: Awake, no distress.  CV:  Good peripheral perfusion.  Resp:  Normal effort.  Abd:  No distention.  MSK:  No deformity noted.  Neuro:  No focal deficits appreciated. Other:  Dorsal bruising of the right hand as pictured below to the base of the second digit.  No palmar pathology.  Full active and passive range of motion.  Some mild tenderness to palpation     ED Results / Procedures / Treatments   Labs (all labs ordered are listed, but only abnormal results are displayed) Labs Reviewed - No data to display  EKG   RADIOLOGY Plain film the right  hand interpreted by me without evidence of fracture or dislocation  Official radiology report(s): DG Hand Complete Right  Result Date: 08/03/2022 CLINICAL DATA:  Status post trauma. EXAM: RIGHT HAND - COMPLETE 3+ VIEW COMPARISON:  None Available. FINDINGS: There is no evidence of fracture or dislocation. There is no evidence of arthropathy or other focal bone abnormality. Soft tissues are unremarkable. IMPRESSION: Negative. Electronically Signed   By: Aram Candela M.D.   On: 08/03/2022 00:12    PROCEDURES and INTERVENTIONS:  Procedures  Medications - No data to display   IMPRESSION / MDM / ASSESSMENT AND PLAN / ED COURSE  I reviewed the triage vital signs and the nursing notes.  Differential diagnosis includes, but is not limited to, tendinous injury, bruising, fracture or dislocation  22 year old presents with bruising to the right hand after jamming her finger, suitable for outpatient management with immobilization and conservative measures.  X-ray without bony injury.  Full active and passive ROM so I doubt any full-thickness or significant  tendinous injury.  We discussed conservative measures and provided hand surgery follow-up as an outpatient in case her symptoms do not improve       FINAL CLINICAL IMPRESSION(S) / ED DIAGNOSES   Final diagnoses:  Contusion of right hand, initial encounter  Right hand pain     Rx / DC Orders   ED Discharge Orders     None        Note:  This document was prepared using Dragon voice recognition software and may include unintentional dictation errors.   Delton Prairie, MD 08/03/22 [phone]

## 2022-08-03 NOTE — Discharge Instructions (Addendum)
Please take Tylenol and ibuprofen/Advil for your pain.  It is safe to take them together, or to alternate them every few hours.  Take up to 1000mg  of Tylenol at a time, up to 4 times per day.  Do not take more than 4000 mg of Tylenol in 24 hours.  For ibuprofen, take 400-600 mg, 3 - 4 times per day.  Splint the finger, use ice.   If your symptoms were to worsen or not improve after a week or so, then reach out to the hand surgeon to be seen in the clinic.  If the finger stops working altogether or uncontrolled pain or fevers then please return to the ED

## 2022-10-04 ENCOUNTER — Other Ambulatory Visit: Payer: Self-pay

## 2022-10-04 ENCOUNTER — Emergency Department: Payer: Managed Care, Other (non HMO)

## 2022-10-04 ENCOUNTER — Emergency Department
Admission: EM | Admit: 2022-10-04 | Discharge: 2022-10-04 | Disposition: A | Discharge status: Home / Self Care | Payer: Managed Care, Other (non HMO) | Attending: Emergency Medicine | Admitting: Emergency Medicine

## 2022-10-04 DIAGNOSIS — S300XXA Contusion of lower back and pelvis, initial encounter: Secondary | ICD-10-CM | POA: Insufficient documentation

## 2022-10-04 DIAGNOSIS — W19XXXA Unspecified fall, initial encounter: Secondary | ICD-10-CM

## 2022-10-04 DIAGNOSIS — S3992XA Unspecified injury of lower back, initial encounter: Secondary | ICD-10-CM | POA: Diagnosis present

## 2022-10-04 DIAGNOSIS — Y9301 Activity, walking, marching and hiking: Secondary | ICD-10-CM | POA: Insufficient documentation

## 2022-10-04 DIAGNOSIS — W108XXA Fall (on) (from) other stairs and steps, initial encounter: Secondary | ICD-10-CM | POA: Insufficient documentation

## 2022-10-04 MED ORDER — MELOXICAM 15 MG PO TABS: 15.0000 mg | ORAL_TABLET | Freq: Every day | ORAL | 0 refills | Status: AC

## 2022-10-04 MED ORDER — METHOCARBAMOL 500 MG PO TABS: 500.0000 mg | ORAL_TABLET | Freq: Four times a day (QID) | ORAL | 0 refills | Status: AC

## 2022-10-04 MED ORDER — HYDROCODONE-ACETAMINOPHEN 5-325 MG PO TABS
1.0000 | ORAL_TABLET | Freq: Once | ORAL | Status: AC
  Administered 2022-10-04: 1 via ORAL
  Filled 2022-10-04: qty 1

## 2022-10-04 MED ORDER — KETOROLAC TROMETHAMINE 30 MG/ML IJ SOLN
30.0000 mg | Freq: Once | INTRAMUSCULAR | Status: AC
  Administered 2022-10-04: 30 mg via INTRAMUSCULAR
  Filled 2022-10-04: qty 1

## 2022-10-04 MED ORDER — METHOCARBAMOL 500 MG PO TABS
1000.0000 mg | ORAL_TABLET | Freq: Once | ORAL | Status: AC
  Administered 2022-10-04: 1000 mg via ORAL
  Filled 2022-10-04: qty 2

## 2022-10-04 NOTE — ED Triage Notes (Addendum)
Pt fell down 3 stairs outside due to the wet ground last night. Pt fell on cocyx and c/o tailbone pain, reports mild right shoulder soreness. Pt denies LOC, no open cuts or abrasions, no blood thinners. Pt was told by boss to get cleared for work.

## 2022-10-04 NOTE — ED Notes (Signed)
See triage note. Pt able to ambulate but states it is very painful.

## 2022-10-04 NOTE — ED Provider Notes (Signed)
[facility] Provider Note  Patient Contact: 9:43 PM (approximate)   History   Fall   HPI  Robyn Key is a 22 y.o. female who presents the emergency department complaining of low back and tailbone pain.  Patient states that she was coming down a flight of stairs that were wet yesterday, slipped and fell onto head landed on her back/buttocks area.  Patient is having pain to this region.  No concerning neurodeficits or symptoms down the legs with radicular pain, bowel or bladder dysfunction, saddle anesthesia or paresthesias.  Patient denies any head injury.     Physical Exam   Triage Vital Signs: ED Triage Vitals  Encounter Vitals Group     BP 10/04/22 1751 98/79     Systolic BP Percentile --      Diastolic BP Percentile --      Pulse Rate 10/04/22 1751 (!) 107     Resp 10/04/22 1751 16     Temp 10/04/22 1751 98.9 F (37.2 C)     Temp Source 10/04/22 1751 Oral     SpO2 10/04/22 1751 97 %     Weight --      Height --      Head Circumference --      Peak Flow --      Pain Score 10/04/22 1750 6     Pain Loc --      Pain Education --      Exclude from Growth Chart --     Most recent vital signs: Vitals:   10/04/22 1751 10/04/22 2015  BP: 98/79 115/70  Pulse: (!) 107 67  Resp: 16 17  Temp: 98.9 F (37.2 C) 98.3 F (36.8 C)  SpO2: 97% 96%     General: Alert and in no acute distress. Head: No acute traumatic findings  Neck: No stridor. No cervical spine tenderness to palpation.  Cardiovascular:  Good peripheral perfusion Respiratory: Normal respiratory effort without tachypnea or retractions. Lungs CTAB. Good air entry to the bases with no decreased or absent breath sounds. Musculoskeletal: Full range of motion to all extremities.  Visualization of the lumbar spine and pelvic region reveals no visible signs of trauma with abrasions, lacerations, large areas of ecchymosis.  Tender over the sacral spine into the coccyx region without  palpable abnormality.  Pulses and sensation intact and equal bilateral lower extremities. Neurologic:  No gross focal neurologic deficits are appreciated.  Skin:   No rash noted Other:   ED Results / Procedures / Treatments   Labs (all labs ordered are listed, but only abnormal results are displayed) Labs Reviewed - No data to display   EKG     RADIOLOGY  I personally viewed, evaluated, and interpreted these images as part of my medical decision making, as well as reviewing the written report by the radiologist.  ED Provider Interpretation: No acute finding on x-ray.  DG Sacrum/Coccyx  Result Date: 10/04/2022 CLINICAL DATA:  Injury, fall. EXAM: SACRUM AND COCCYX - 2+ VIEW COMPARISON:  None Available. FINDINGS: There is no evidence of fracture or other focal bone lesions. IUD is present. IMPRESSION: Negative. Electronically Signed   By: Darliss Cheney M.D.   On: 10/04/2022 18:42    PROCEDURES:  Critical Care performed: No  Procedures   MEDICATIONS ORDERED IN ED: Medications  HYDROcodone-acetaminophen (NORCO/VICODIN) 5-325 MG per tablet 1 tablet (1 tablet Oral Given 10/04/22 2144)  ketorolac (TORADOL) 30 MG/ML injection 30 mg (30 mg Intramuscular Given 10/04/22 2206)  methocarbamol (ROBAXIN) tablet 1,000 mg (1,000 mg Oral Given 10/04/22 2205)     IMPRESSION / MDM / ASSESSMENT AND PLAN / ED COURSE  I reviewed the triage vital signs and the nursing notes.                                 Differential diagnosis includes, but is not limited to, contusion, sciatica, compression fracture, sacral fracture, coccyx fracture   Patient's presentation is most consistent with acute presentation with potential threat to life or bodily function.   Patient's diagnosis is consistent with fall, contusion.  Patient presents emergency department after falling on some wet stairs last night laying on her back/buttocks region.  Patient had pain in the sacrum and coccyx region.  Negative x-ray.   This time symptom control medication prescribed for the patient to include anti-inflammatory muscle relaxers.  Concerning signs and symptoms discussed with the patient.  Follow-up primary care as needed..  Patient is given ED precautions to return to the ED for any worsening or new symptoms.     FINAL CLINICAL IMPRESSION(S) / ED DIAGNOSES   Final diagnoses:  Fall, initial encounter  Contusion of sacrum, initial encounter     Rx / DC Orders   ED Discharge Orders          Ordered    meloxicam (MOBIC) 15 MG tablet  Daily        10/04/22 2156    methocarbamol (ROBAXIN) 500 MG tablet  4 times daily        10/04/22 2156             Note:  This document was prepared using Dragon voice recognition software and may include unintentional dictation errors.   Racheal Patches, PA-C 10/04/22 2305    Janith Lima, MD 10/06/22 [phone]

## 2023-04-17 ENCOUNTER — Encounter: Payer: Managed Care, Other (non HMO) | Admitting: Obstetrics and Gynecology

## 2023-04-23 ENCOUNTER — Encounter: Payer: Self-pay | Admitting: Obstetrics and Gynecology

## 2023-05-11 NOTE — Progress Notes (Deleted)
 New patient visit  Patient: Robyn Key   DOB: May 08, 2000   22 y.o. Female  MRN: [id number] Visit Date: 05/13/2023  Today's healthcare provider: Debera Lat, PA-C   No chief complaint on file.  Subjective    Robyn Key is a 23 y.o. female who presents today as a new patient to establish care.  HPI  *** Discussed the use of AI scribe software for clinical note transcription with the patient, who gave verbal consent to proceed.  History of Present Illness            Past Medical History:  Diagnosis Date  . Allergy    seasonal  . Migraine headache   . Obesity   . Seizures (HCC)    when she was younger, 2 seizures, last one 6-7 years ago.  . Vision abnormalities    wears glasses   Past Surgical History:  Procedure Laterality Date  . INTRAUTERINE DEVICE INSERTION  01/09/2017   Mirena  . NO PAST SURGERIES    . TONSILLECTOMY AND ADENOIDECTOMY Bilateral 05/23/2016   Procedure: TONSILLECTOMY AND ADENOIDECTOMY;  Surgeon: Bud Face, MD;  Location: [facility] ORS;  Service: ENT;  Laterality: Bilateral;   Family Status  Relation Name Status  . Mother  Alive  . Father  Alive  . MGF  Alive       MI around age 82  . MGM  Alive       Melanoma  . PGM  Deceased  . PGF  Deceased  No partnership data on file   Family History  Problem Relation Age of Onset  . Hypertension Mother   . Cancer Father        prostate  . Hypertension Maternal Grandfather    Social History   Socioeconomic History  . Marital status: Single    Spouse name: Not on file  . Number of children: Not on file  . Years of education: Not on file  . Highest education level: Not on file  Occupational History  . Not on file  Tobacco Use  . Smoking status: Never  . Smokeless tobacco: Never  Vaping Use  . Vaping status: Every Day  Substance and Sexual Activity  . Alcohol use: No  . Drug use: Not Currently  . Sexual activity: Yes    Birth control/protection: Condom, I.U.D.    Comment:  Mirena inserted 01-09-17-intercourse age 56, less than 5 sexual partners  Mirena 01/09/2017  Other Topics Concern  . Not on file  Social History Narrative  . Not on file   Social Drivers of Health   Financial Resource Strain: Not on file  Food Insecurity: Not on file  Transportation Needs: Not on file  Physical Activity: Not on file  Stress: Not on file  Social Connections: Not on file   Outpatient Medications Prior to Visit  Medication Sig  . acetaminophen (TYLENOL) 500 MG tablet Take 500 mg by mouth every 6 (six) hours as needed.  Marland Kitchen amoxicillin (AMOXIL) 500 MG capsule Take 1 capsule (500 mg total) by mouth 3 (three) times daily.  . cyclobenzaprine (FLEXERIL) 5 MG tablet Take 1 tablet (5 mg total) by mouth at bedtime.  Marland Kitchen HYDROcodone-acetaminophen (NORCO) 5-325 MG tablet Take 1 tablet by mouth every 6 (six) hours as needed for moderate pain.  Marland Kitchen ibuprofen (ADVIL,MOTRIN) 200 MG tablet Take 200 mg by mouth every 6 (six) hours as needed.  . meloxicam (MOBIC) 15 MG tablet Take 1 tablet (15 mg total) by mouth daily.  Marland Kitchen  methocarbamol (ROBAXIN) 500 MG tablet Take 1 tablet (500 mg total) by mouth 4 (four) times daily.   No facility-administered medications prior to visit.   Allergies  Allergen Reactions  . Mango Flavoring Agent (Non-Screening) Other (See Comments)    Tingling of lips, mom denies swelling of lips or tongue or difficulty breathing.  . Papaya Derivatives Other (See Comments)    Tingling of the lips, denies swelling of the lips or tongue or difficulty breathing.  Jacelyn Grip Other (See Comments)    Tingling of the lips, mom denies swelling of lips or tongue or difficulty breathing.    Immunization History  Administered Date(s) Administered  . HPV Quadrivalent 09/23/2012, 12/01/2012, 05/13/2013  . Influenza,inj,Quad PF,6+ Mos 12/01/2012, 12/08/2013  . Meningococcal Conjugate 09/23/2012    Health Maintenance  Topic Date Due  . DTaP/Tdap/Td (1 - Tdap) Never done  .  CHLAMYDIA SCREENING  12/11/2017  . Cervical Cancer Screening (Pap smear)  Never done  . INFLUENZA VACCINE  09/26/2022  . COVID-19 Vaccine (1 - 2024-25 season) Never done  . HPV VACCINES  Completed  . Hepatitis C Screening  Completed  . HIV Screening  Completed    Patient Care Team: Pcp, No as PCP - General  Review of Systems  All other systems reviewed and are negative. Except see HPI   {Insert previous labs (optional):23779} {See past labs  Heme  Chem  Endocrine  Serology  Results Review (optional):1}   Objective    There were no vitals taken for this visit. {Insert last BP/Wt (optional):23777}{See vitals history (optional):1}   Physical Exam Vitals reviewed.  Constitutional:      General: She is not in acute distress.    Appearance: Normal appearance. She is well-developed. She is not diaphoretic.  HENT:     Head: Normocephalic and atraumatic.  Eyes:     General: No scleral icterus.    Conjunctiva/sclera: Conjunctivae normal.  Neck:     Thyroid: No thyromegaly.  Cardiovascular:     Rate and Rhythm: Normal rate and regular rhythm.     Pulses: Normal pulses.     Heart sounds: Normal heart sounds. No murmur heard. Pulmonary:     Effort: Pulmonary effort is normal. No respiratory distress.     Breath sounds: Normal breath sounds. No wheezing, rhonchi or rales.  Musculoskeletal:     Cervical back: Neck supple.     Right lower leg: No edema.     Left lower leg: No edema.  Lymphadenopathy:     Cervical: No cervical adenopathy.  Skin:    General: Skin is warm and dry.     Findings: No rash.  Neurological:     Mental Status: She is alert and oriented to person, place, and time. Mental status is at baseline.  Psychiatric:        Mood and Affect: Mood normal.        Behavior: Behavior normal.   Depression Screen     No data to display         No results found for any visits on 05/13/23.  Assessment & Plan     *** Assessment and Plan               Encounter to establish care Welcomed to our clinic Reviewed past medical hx, social hx, family hx and surgical hx Pt advised to send all vaccination records or screening   No follow-ups on file.    The patient was advised to call back or seek  an in-person evaluation if the symptoms worsen or if the condition fails to improve as anticipated.  I discussed the assessment and treatment plan with the patient. The patient was provided an opportunity to ask questions and all were answered. The patient agreed with the plan and demonstrated an understanding of the instructions.  I, Debera Lat, PA-C have reviewed all documentation for this visit. The documentation on  05/13/2023   for the exam, diagnosis, procedures, and orders are all accurate and complete.  Debera Lat, [facility], MMS [facility] [phone] (phone) [phone] (fax)  [facility]

## 2023-05-13 ENCOUNTER — Ambulatory Visit: Payer: Managed Care, Other (non HMO) | Admitting: Physician Assistant

## 2023-11-07 ENCOUNTER — Encounter: Payer: Self-pay | Admitting: Radiology

## 2023-11-07 ENCOUNTER — Other Ambulatory Visit (HOSPITAL_COMMUNITY)
Admission: RE | Admit: 2023-11-07 | Discharge: 2023-11-07 | Disposition: A | Discharge status: Home / Self Care | Source: Ambulatory Visit | Attending: Radiology | Admitting: Radiology

## 2023-11-07 ENCOUNTER — Ambulatory Visit: Payer: Self-pay | Admitting: Radiology

## 2023-11-07 VITALS — BP 98/56 | HR 111 | Ht 68.0 in | Wt 211.0 lb

## 2023-11-07 DIAGNOSIS — Z975 Presence of (intrauterine) contraceptive device: Secondary | ICD-10-CM

## 2023-11-07 DIAGNOSIS — Z1331 Encounter for screening for depression: Secondary | ICD-10-CM | POA: Diagnosis not present

## 2023-11-07 DIAGNOSIS — Z01419 Encounter for gynecological examination (general) (routine) without abnormal findings: Secondary | ICD-10-CM

## 2023-11-07 DIAGNOSIS — Z113 Encounter for screening for infections with a predominantly sexual mode of transmission: Secondary | ICD-10-CM | POA: Diagnosis present

## 2023-11-07 NOTE — Progress Notes (Signed)
 Robyn Key 11/14/2000 [id number]   History:  23 y.o. G0 presents for annual exam. No gyn concerns. Mirena  placed 11/18  Gynecologic History Patient's last menstrual period was 11/05/2023 (approximate). Period Cycle (Days): 28 Period Duration (Days): 5 Period Pattern: Regular Menstrual Flow: Moderate, Light, Heavy Menstrual Control: Tampon Dysmenorrhea: (!) Mild Dysmenorrhea Symptoms: Cramping Contraception/Family planning: IUD Sexually active: yes   Obstetric History OB History  Gravida Para Term Preterm AB Living  0 0 0 0 0 0  SAB IAB Ectopic Multiple Live Births  0 0 0 0 0       11/07/2023   11:05 AM  Depression screen PHQ 2/9  Decreased Interest 2  Down, Depressed, Hopeless 1  PHQ - 2 Score 3  Altered sleeping 1  Tired, decreased energy 1  Change in appetite 2  Feeling bad or failure about yourself  0  Trouble concentrating 0  Moving slowly or fidgety/restless 1  Suicidal thoughts 0  PHQ-9 Score 8  Difficult doing work/chores Somewhat difficult     The following portions of the patient's history were reviewed and updated as appropriate: allergies, current medications, past family history, past medical history, past social history, past surgical history, and problem list.  Review of Systems  All other systems reviewed and are negative.   Past medical history, past surgical history, family history and social history were all reviewed and documented in the EPIC chart.  Exam:  Vitals:   11/07/23 1103  BP: (!) 98/56  Pulse: (!) 111  SpO2: 99%  Weight: 211 lb (95.7 kg)  Height: 5' 8 (1.727 m)   Body mass index is 32.08 kg/m.  Physical Exam Vitals and nursing note reviewed. Exam conducted with a chaperone present.  Constitutional:      Appearance: Normal appearance. She is normal weight.  HENT:     Head: Normocephalic and atraumatic.  Neck:     Thyroid: No thyroid mass, thyromegaly or thyroid tenderness.  Cardiovascular:     Rate and Rhythm:  Regular rhythm.     Heart sounds: Normal heart sounds.  Pulmonary:     Effort: Pulmonary effort is normal.     Breath sounds: Normal breath sounds.  Chest:  Breasts:    Breasts are symmetrical.     Right: Normal. No inverted nipple, mass, nipple discharge, skin change or tenderness.     Left: Normal. No inverted nipple, mass, nipple discharge, skin change or tenderness.  Abdominal:     General: Abdomen is flat. Bowel sounds are normal.     Palpations: Abdomen is soft.  Genitourinary:    General: Normal vulva.     Vagina: Normal. No vaginal discharge, bleeding or lesions.     Cervix: Normal. No discharge or lesion.     Uterus: Normal. Not enlarged and not tender.      Adnexa: Right adnexa normal and left adnexa normal.       Right: No mass, tenderness or fullness.         Left: No mass, tenderness or fullness.       Comments: IUD strings seen at os Lymphadenopathy:     Upper Body:     Right upper body: No axillary adenopathy.     Left upper body: No axillary adenopathy.  Skin:    General: Skin is warm and dry.  Neurological:     Mental Status: She is alert and oriented to person, place, and time.  Psychiatric:        Mood and Affect:  Mood normal.        Thought Content: Thought content normal.        Judgment: Judgment normal.      Darice Hoit, CMA present for exam  Assessment/Plan:   1. Well woman exam with routine gynecological exam (Primary) - Cytology - PAP( [place])  2. Screening for STDs (sexually transmitted diseases) - Cytology - PAP( [place])  3. Depression screen  4. IUD (intrauterine device) in place Due for removal 12/2024    Return in about 1 year (around 11/06/2024) for Annual.  GINETTE COZIER B WHNP-BC 11:23 AM 11/07/2023

## 2023-11-07 NOTE — Patient Instructions (Signed)

## 2023-11-12 ENCOUNTER — Ambulatory Visit: Payer: Self-pay | Admitting: Radiology

## 2023-11-12 LAB — CYTOLOGY - PAP
Adequacy: ABSENT
Chlamydia: NEGATIVE
Comment: NEGATIVE
Comment: NEGATIVE
Comment: NORMAL
Diagnosis: NEGATIVE
Neisseria Gonorrhea: NEGATIVE
Trichomonas: NEGATIVE

## 2023-11-20 ENCOUNTER — Encounter: Admitting: Obstetrics and Gynecology

## 2023-12-05 ENCOUNTER — Ambulatory Visit: Admitting: Physician Assistant

## 2024-01-12 ENCOUNTER — Ambulatory Visit: Admitting: Physician Assistant
